# Patient Record
Sex: Female | Born: 1980 | Race: White | Hispanic: No | Marital: Married | Smoking: Current every day smoker
Health system: Southern US, Community
[De-identification: ages and names within clinical notes are randomized; demographics above are authoritative.]

## PROBLEM LIST (undated history)

## (undated) HISTORY — PX: CHOLECYSTECTOMY: SHX55

## (undated) HISTORY — PX: KNEE SURGERY: SHX244

---

## 2004-11-27 ENCOUNTER — Ambulatory Visit (HOSPITAL_COMMUNITY): Payer: Self-pay | Admitting: Psychiatry

## 2004-12-17 ENCOUNTER — Ambulatory Visit (HOSPITAL_COMMUNITY): Payer: Self-pay | Admitting: Licensed Clinical Social Worker

## 2010-05-08 ENCOUNTER — Emergency Department (HOSPITAL_BASED_OUTPATIENT_CLINIC_OR_DEPARTMENT_OTHER)
Admission: EM | Admit: 2010-05-08 | Discharge: 2010-05-08 | Payer: Self-pay | Source: Home / Self Care | Admitting: Emergency Medicine

## 2010-10-02 ENCOUNTER — Emergency Department (HOSPITAL_BASED_OUTPATIENT_CLINIC_OR_DEPARTMENT_OTHER)
Admission: EM | Admit: 2010-10-02 | Discharge: 2010-10-03 | Disposition: A | Discharge status: Other Acute Inpatient Hospital | Payer: BC Managed Care – PPO | Attending: Emergency Medicine | Admitting: Emergency Medicine

## 2010-10-02 DIAGNOSIS — A419 Sepsis, unspecified organism: Secondary | ICD-10-CM | POA: Insufficient documentation

## 2010-10-02 DIAGNOSIS — F17200 Nicotine dependence, unspecified, uncomplicated: Secondary | ICD-10-CM | POA: Insufficient documentation

## 2010-10-02 DIAGNOSIS — Z79899 Other long term (current) drug therapy: Secondary | ICD-10-CM | POA: Insufficient documentation

## 2010-10-02 DIAGNOSIS — N12 Tubulo-interstitial nephritis, not specified as acute or chronic: Secondary | ICD-10-CM | POA: Insufficient documentation

## 2010-10-02 DIAGNOSIS — R300 Dysuria: Secondary | ICD-10-CM | POA: Insufficient documentation

## 2010-10-02 DIAGNOSIS — K219 Gastro-esophageal reflux disease without esophagitis: Secondary | ICD-10-CM | POA: Insufficient documentation

## 2010-10-02 LAB — DIFFERENTIAL
Basophils Relative: 0 % (ref 0–1)
Eosinophils Absolute: 0 10*3/uL (ref 0.0–0.7)
Lymphs Abs: 1.3 10*3/uL (ref 0.7–4.0)
Monocytes Absolute: 1.5 10*3/uL — ABNORMAL HIGH (ref 0.1–1.0)
Neutrophils Relative %: 77 % (ref 43–77)

## 2010-10-02 LAB — CBC
MCHC: 34.2 g/dL (ref 30.0–36.0)
MCV: 86.9 fL (ref 78.0–100.0)
Platelets: 190 10*3/uL (ref 150–400)
RDW: 14.4 % (ref 11.5–15.5)
WBC: 12.1 10*3/uL — ABNORMAL HIGH (ref 4.0–10.5)

## 2010-10-02 LAB — COMPREHENSIVE METABOLIC PANEL
AST: 15 U/L (ref 0–37)
Albumin: 3.2 g/dL — ABNORMAL LOW (ref 3.5–5.2)
BUN: 7 mg/dL (ref 6–23)
Calcium: 8.6 mg/dL (ref 8.4–10.5)
Chloride: 99 mEq/L (ref 96–112)
Creatinine, Ser: 0.8 mg/dL (ref 0.50–1.10)
Total Protein: 6.9 g/dL (ref 6.0–8.3)

## 2010-10-02 LAB — URINE MICROSCOPIC-ADD ON

## 2010-10-02 LAB — URINALYSIS, ROUTINE W REFLEX MICROSCOPIC
Glucose, UA: NEGATIVE mg/dL
Ketones, ur: NEGATIVE mg/dL
Protein, ur: 100 mg/dL — AB
pH: 6.5 (ref 5.0–8.0)

## 2010-10-02 LAB — LACTIC ACID, PLASMA: Lactic Acid, Venous: 1.7 mmol/L (ref 0.5–2.2)

## 2010-10-02 LAB — PREGNANCY, URINE: Preg Test, Ur: NEGATIVE

## 2010-10-03 ENCOUNTER — Inpatient Hospital Stay (HOSPITAL_COMMUNITY): Payer: BC Managed Care – PPO

## 2010-10-03 ENCOUNTER — Inpatient Hospital Stay (HOSPITAL_COMMUNITY)
Admission: AD | Admit: 2010-10-03 | Discharge: 2010-10-06 | DRG: 320 | Disposition: A | Discharge status: Home / Self Care | Payer: BC Managed Care – PPO | Source: Other Acute Inpatient Hospital | Attending: Internal Medicine | Admitting: Internal Medicine

## 2010-10-03 DIAGNOSIS — F411 Generalized anxiety disorder: Secondary | ICD-10-CM | POA: Diagnosis present

## 2010-10-03 DIAGNOSIS — R079 Chest pain, unspecified: Secondary | ICD-10-CM | POA: Diagnosis present

## 2010-10-03 DIAGNOSIS — E876 Hypokalemia: Secondary | ICD-10-CM | POA: Diagnosis not present

## 2010-10-03 DIAGNOSIS — N10 Acute tubulo-interstitial nephritis: Principal | ICD-10-CM | POA: Diagnosis present

## 2010-10-03 DIAGNOSIS — F17200 Nicotine dependence, unspecified, uncomplicated: Secondary | ICD-10-CM | POA: Diagnosis present

## 2010-10-03 DIAGNOSIS — K219 Gastro-esophageal reflux disease without esophagitis: Secondary | ICD-10-CM | POA: Diagnosis present

## 2010-10-03 LAB — CBC
HCT: 34.8 % — ABNORMAL LOW (ref 36.0–46.0)
Hemoglobin: 12.1 g/dL (ref 12.0–15.0)
RBC: 3.98 MIL/uL (ref 3.87–5.11)
WBC: 11.1 10*3/uL — ABNORMAL HIGH (ref 4.0–10.5)

## 2010-10-03 LAB — PROCALCITONIN: Procalcitonin: 0.31 ng/mL

## 2010-10-03 LAB — CK TOTAL AND CKMB (NOT AT ARMC)
CK, MB: 1 ng/mL (ref 0.3–4.0)
CK, MB: 1.1 ng/mL (ref 0.3–4.0)
CK, MB: 1 ng/mL (ref 0.3–4.0)
Relative Index: INVALID (ref 0.0–2.5)
Total CK: 92 U/L (ref 7–177)
Total CK: 103 U/L (ref 7–177)
Total CK: 97 U/L (ref 7–177)

## 2010-10-03 LAB — BASIC METABOLIC PANEL
Calcium: 7.5 mg/dL — ABNORMAL LOW (ref 8.4–10.5)
GFR calc Af Amer: >60 mL/min (ref >60)
GFR calc non Af Amer: >60 mL/min (ref >60)
Glucose, Bld: 123 mg/dL — ABNORMAL HIGH (ref 70–99)
Potassium: 3.2 mEq/L — ABNORMAL LOW (ref 3.5–5.1)
Sodium: 133 mEq/L — ABNORMAL LOW (ref 135–145)

## 2010-10-03 LAB — TROPONIN I: Troponin I: <0.3 ng/mL (ref <0.30)

## 2010-10-03 LAB — URINE CULTURE: Colony Count: 50000

## 2010-10-03 MED ORDER — IOHEXOL 300 MG/ML  SOLN
100.0000 mL | Freq: Once | INTRAMUSCULAR | Status: AC | PRN
  Administered 2010-10-03: 100 mL via INTRAVENOUS

## 2010-10-04 ENCOUNTER — Inpatient Hospital Stay (HOSPITAL_COMMUNITY): Payer: BC Managed Care – PPO

## 2010-10-04 LAB — URINE CULTURE: Colony Count: NO GROWTH

## 2010-10-04 LAB — COMPREHENSIVE METABOLIC PANEL
Alkaline Phosphatase: 51 U/L (ref 39–117)
BUN: 3 mg/dL — ABNORMAL LOW (ref 6–23)
Creatinine, Ser: 0.59 mg/dL (ref 0.50–1.10)
GFR calc Af Amer: >60 mL/min (ref >60)
Glucose, Bld: 116 mg/dL — ABNORMAL HIGH (ref 70–99)
Potassium: 3.3 mEq/L — ABNORMAL LOW (ref 3.5–5.1)
Total Bilirubin: 0.2 mg/dL — ABNORMAL LOW (ref 0.3–1.2)
Total Protein: 6.2 g/dL (ref 6.0–8.3)

## 2010-10-04 LAB — CBC
Hemoglobin: 11 g/dL — ABNORMAL LOW (ref 12.0–15.0)
MCH: 30.1 pg (ref 26.0–34.0)
MCHC: 34.5 g/dL (ref 30.0–36.0)
MCV: 87.4 fL (ref 78.0–100.0)
RBC: 3.65 MIL/uL — ABNORMAL LOW (ref 3.87–5.11)

## 2010-10-05 LAB — BASIC METABOLIC PANEL
BUN: 3 mg/dL — ABNORMAL LOW (ref 6–23)
Chloride: 103 mEq/L (ref 96–112)
Glucose, Bld: 84 mg/dL (ref 70–99)
Potassium: 3.9 mEq/L (ref 3.5–5.1)

## 2010-10-09 LAB — CULTURE, BLOOD (ROUTINE X 2)
Culture  Setup Time: 201206210224
Culture  Setup Time: 201206211250
Culture: NO GROWTH
Culture: NO GROWTH
Culture: NO GROWTH

## 2010-10-09 NOTE — H&P (Signed)
[name], [name]                 ACCOUNT NO.:  [id number]  MEDICAL RECORD NO.:  [id number]  LOCATION:  2505                         FACILITY:  [facility]  PHYSICIAN:  Kela Millin, M.D.DATE OF BIRTH:  12-16-1980  DATE OF ADMISSION:  10/03/2010 DATE OF DISCHARGE:                             HISTORY & PHYSICAL   CHIEF COMPLAINT:  Fevers x3days with lower abdominal pain.  HISTORY OF PRESENT ILLNESS:  The patient is a pleasant 30 year old white female with past medical history significant for recently diagnosed urinary tract infection, for which she was started on Macrobid at [facility] about 2 days ago, also history of GERD, tobacco abuse who presents with above complaints.  She states that she was in her usual state of health until about 4 days ago when she developed lower abdominal and back pain.  She also began having fevers and went to [facility] where she was started on Macrobid 2 days ago, but she has continued to be febrile and also complaining of flank pain, right greater than left.  She stated that she has been taking ibuprofen at home to help control the fevers, but they were persisting and she reported that she took an Extra-Strength Tylenol prior to going to the [facility] ED but was still found to have a temperature of 103 upon arrival.  She also endorses chest pain - described as a mid sternal to left precordial chest tightness, 2/10 in intensity.  She denies any associated nausea or vomiting or shortness of breath.  She admits to diaphoresis but states that she attributed that to the fevers she was having.  The patient was seen at the [facility] ED and a urinalysis was done and revealed large hemoglobin with moderate leukocyte esterase and 11-20 wbc's, a few bacteria, and rare urine epithelial cells.  Her blood pressure upon arrival at the ED was 91/59 and improved to 119/69 on recheck, she was initially also tachycardic at 130 and that improved to 102.  She was  started on empiric antibiotics following the urinalysis and admitted for further evaluation and management.  The patient also complains of neck pain, but denies nuchal rigidity.  PAST MEDICAL HISTORY: 1. As above. 2. History of acne, for which she takes ampicillin.  MEDICATIONS:  Macrobid, Vicodin, Prilosec, ampicillin, oral contraceptives.  ALLERGIES:  CIPRO, MINOCYCLINE, and SULFA.  SOCIAL HISTORY:  She smokes a pack of cigarettes every other day, occasional alcohol.  FAMILY HISTORY:  Her maternal grandfather died in his early 56s of a heart attack.  REVIEW OF SYSTEMS:  As per HPI, other review of systems are negative.  PHYSICAL EXAMINATION:  GENERAL:  The patient is a young white female in no respiratory distress. VITAL SIGNS:  Temperature is 102.2 with a pulse of 112, respiratory rate of 16, blood pressure 146/83, initial blood pressure 91/59, HEENT:  PERRL, EOMI, moist mucous membranes, and no oral exudates. NECK:  Supple, no adenopathy, no thyromegaly, and no JVD. LUNGS:  Clear to auscultation bilaterally.  No crackles or wheezes. ABDOMEN:  Suprapubic tenderness present, no rebound tenderness.  Bowel sounds present and no masses palpable.  Also, right CVA tenderness greater than left. EXTREMITIES:  No cyanosis  and no edema. NEURO:  She is alert and oriented x3.  Cranial nerves II through XII grossly intact.  Nonfocal exam.  LABORATORY DATA:  Urinalysis as per HPI.  Also, her sodium is 133 with a potassium of 3.6, chloride of 99, CO2 of 20, glucose 143, BUN of 7, creatinine of 0.80, calcium 8.6, total protein of 6.9, albumin is 3.2. White cell count is 12.1 with a hemoglobin of 12.5 and a hematocrit of 36.5, the platelet count is 190, and her lactic acid level was 1.7.  ASSESSMENT AND PLAN: 1. Probable pyelonephritis/urinary tract infection with sepsis     syndrome - we will obtain blood and urine cultures, empiric     antibiotics, and follow. 2. Chest pain - as  discussed above, we will cycle cardiac enzymes,     place on aspirin, nitroglycerin, follow, and consider cardiac     consultation pending enzymes. 3. Neck pain - cardiac enzymes as above, obtain x-rays of her neck,     follow, and further manage accordingly, pain management. 4. Gastroesophageal reflux disease - was placed on a PPI. 5. Tobacco - smoking cessation consult.     Kela Millin, M.D.     ACV/MEDQ  D:  10/03/2010  T:  10/03/2010  Job:  [id number]  Electronically Signed by Donnalee Curry M.D. on 10/09/2010 07:37:04 AM

## 2010-10-16 NOTE — Discharge Summary (Signed)
Andrea [name], Andrea [name] NO.:  [id number]  MEDICAL RECORD NO.:  [id number]  LOCATION:  2505                         FACILITY:  [facility]  PHYSICIAN:  Osvaldo Shipper, MD     DATE OF BIRTH:  1980-10-06  DATE OF ADMISSION:  10/03/2010 DATE OF DISCHARGE:  10/06/2010                              DISCHARGE SUMMARY   The [facility].  No consultations obtained during this admission.  Imaging studies done include: 1. Chest x-ray which showed no acute abnormalities. 2. CT of the chest with contrast did not show any PE or any     dissection. 3. Ultrasound of the kidneys showed possible scarring in the right     kidney, otherwise did not show any other abnormalities.  DISCHARGE DIAGNOSES: 1. Acute pyelonephritis, improved. 2. Chest pain, probably related to her acute illness and anxiety,     resolved. 3. Hypokalemia, corrected. 4. History of gastroesophageal reflux disease, stable. 5. History of tobacco abuse, counseled.  BRIEF HOSPITAL COURSE:  Briefly, this is a 30 year old Caucasian female who presented to the hospital with complaints of fever and lower abdominal pain.  Her UA was abnormal and she was admitted to the hospital with a diagnosis of acute pyelonephritis.  Apparently, she had been to [facility] a few days earlier with similar complaints, had a UA which was remarkably abnormal, and she was started on Macrobid. Unfortunately, no cultures were sent at [facility].  Cultures were done here, however, they have not grown any specific organism.  Her fevers took about 48 hours to resolve.  She also had some lower abdominal discomfort along with some back pain which has also resolved now.  Her white count was elevated at 12.1, which has corrected as well.  She was mentioning some chest pain at the time of admission which was left sided.  Cardiac enzymes were negative.  Evaluation for PE was negative.  The chest pain spontaneously resolved.  It  could have been related to stress and anxiety that the patient has been experiencing over the last few days, especially since one of her close friends passed away.  She was also hypokalemic during this admission which was repleted.  She is complaining of yeast infection in her vaginal area today, so she will be prescribed Diflucan.  She has been asked to hold her ampicillin which she takes for acne while she is on the other antibiotic, Vantin for her pyelonephritis.  On the day of discharge, the patient is feeling better.  She is ambulating with no difficulties.  Fevers have resolved.  Her last temperature is 98.8.  Last time she was febrile with actually a low- grade temperature at 100.8 at 4 a.m. on October 05, 2010.  Blood pressure and heart rate are all normal.  Respiratory rate is normal.  Saturation is normal.  Lungs are clear to auscultation bilaterally. Cardiovascular, S1 and S2 are normal, regular.  No S3, S4, rubs, murmurs, or bruit.  Abdomen is soft, nontender, and nondistended.  Bowel sounds are present.  No masses or organomegaly appreciated.  No labs today.  ASSESSMENT/PLAN:  As per above.  So, overall, the patient is stable  for discharge.  DISCHARGE MEDICATIONS: 1. Diflucan 100 mg daily for 5 days. 2. Vantin 200 mg twice daily for 10 days. 3. Ampicillin to be held while she is on the [place]. 4. Prilosec 20 mg daily. 5. Seasonique which is a birth control pill 1 tablet daily.  FOLLOWUP:  With her primary care providers in 1-2 weeks.  DIET:  Regular.  PHYSICAL ACTIVITY:  As tolerated.  She may return to work on October 14, 2010, or earlier if she feels better.  Total time on this discharge encounter 35 minutes.  Osvaldo Shipper, MD     GK/MEDQ  D:  10/06/2010  T:  10/06/2010  Job:  [id number]  cc:   [facility]  Electronically Signed by Osvaldo Shipper MD on 10/16/2010 07:51:04 PM

## 2012-11-23 ENCOUNTER — Emergency Department (HOSPITAL_BASED_OUTPATIENT_CLINIC_OR_DEPARTMENT_OTHER)
Admission: EM | Admit: 2012-11-23 | Discharge: 2012-11-23 | Disposition: A | Discharge status: Home / Self Care | Payer: BC Managed Care – PPO | Attending: Emergency Medicine | Admitting: Emergency Medicine

## 2012-11-23 ENCOUNTER — Encounter (HOSPITAL_BASED_OUTPATIENT_CLINIC_OR_DEPARTMENT_OTHER): Payer: Self-pay

## 2012-11-23 ENCOUNTER — Emergency Department (HOSPITAL_BASED_OUTPATIENT_CLINIC_OR_DEPARTMENT_OTHER): Payer: BC Managed Care – PPO

## 2012-11-23 DIAGNOSIS — Z87440 Personal history of urinary (tract) infections: Secondary | ICD-10-CM | POA: Insufficient documentation

## 2012-11-23 DIAGNOSIS — Z3202 Encounter for pregnancy test, result negative: Secondary | ICD-10-CM | POA: Insufficient documentation

## 2012-11-23 DIAGNOSIS — R51 Headache: Secondary | ICD-10-CM | POA: Insufficient documentation

## 2012-11-23 DIAGNOSIS — Y9389 Activity, other specified: Secondary | ICD-10-CM | POA: Insufficient documentation

## 2012-11-23 DIAGNOSIS — W108XXA Fall (on) (from) other stairs and steps, initial encounter: Secondary | ICD-10-CM | POA: Insufficient documentation

## 2012-11-23 DIAGNOSIS — Z79899 Other long term (current) drug therapy: Secondary | ICD-10-CM | POA: Insufficient documentation

## 2012-11-23 DIAGNOSIS — M545 Low back pain: Secondary | ICD-10-CM

## 2012-11-23 DIAGNOSIS — F17200 Nicotine dependence, unspecified, uncomplicated: Secondary | ICD-10-CM | POA: Insufficient documentation

## 2012-11-23 DIAGNOSIS — IMO0002 Reserved for concepts with insufficient information to code with codable children: Secondary | ICD-10-CM | POA: Insufficient documentation

## 2012-11-23 DIAGNOSIS — R110 Nausea: Secondary | ICD-10-CM | POA: Insufficient documentation

## 2012-11-23 DIAGNOSIS — Y929 Unspecified place or not applicable: Secondary | ICD-10-CM | POA: Insufficient documentation

## 2012-11-23 LAB — URINE MICROSCOPIC-ADD ON

## 2012-11-23 LAB — URINALYSIS, ROUTINE W REFLEX MICROSCOPIC
Glucose, UA: NEGATIVE mg/dL
Leukocytes, UA: NEGATIVE
pH: 5.5 (ref 5.0–8.0)

## 2012-11-23 MED ORDER — CYCLOBENZAPRINE HCL 5 MG PO TABS: 5.0000 mg | ORAL_TABLET | Freq: Three times a day (TID) | ORAL | Status: AC | PRN

## 2012-11-23 NOTE — ED Notes (Signed)
Pt reports also with hx of utis and pain is similar to this.

## 2012-11-23 NOTE — ED Provider Notes (Signed)
CSN: [id number]     Arrival date & time 11/23/12  1519 History     First MD Initiated Contact with Patient 11/23/12 1543     Chief Complaint  Patient presents with  . Back Pain   HPI  Ricquel Foulk is a 32 year old female with no past medical history who presents to the emergency room for evaluation of lower back pain. Patient states that she's been having lower back pain off-and-on for the past 6 months. She states that her pain is worse when she wakes up the morning and improves throughout the day.  On November 13, 2012 she states that she slipped down a few wet stairs and sustained an injury to her lower back.  No other injuries including head or neck injury.  Since then, she's been having increased lower back pain which is then more continuous in nature.  She describes her pain as a dull aching equal on the left and right.  She denies any numbness, tingling, loss of sensation, or focal weakness.  She has been able to ambulate without difficulty.  Patient states that her pain is increased when she bends forward or twists side to side.  She's tried over-the-counter pain medications without relief.  She is concerned that her pain is not improved which is why she is in the emergency department today.  She also has been nauseated the last few days without any emesis.  She also has had headaches off and on for the past few days with no neck pain/stiffness.  She did not currently have a headache.  She denies any fever, chills, change in activity or appetite, rhinorrhea, congestion, sore throat, cough, shortness of breath, chest pain, abdominal pain, vaginal discharge, vaginal bleeding, dysuria, hematuria, vaginal itching.  She has an IUD.  Patient states she has a history of UTI's which are not similar to her symptoms today.     History reviewed. No pertinent past medical history. Past Surgical History  Procedure Laterality Date  . Knee surgery    . Cholecystectomy     No family history on file. History   Substance Use Topics  . Smoking status: Current Every Day Smoker -- 0.50 packs/day    Types: Cigarettes  . Smokeless tobacco: Not on file  . Alcohol Use: Yes   OB History   Grav Para Term Preterm Abortions TAB SAB Ect Mult Living                 Review of Systems  Constitutional: Negative for fever, chills, activity change, appetite change and fatigue.  HENT: Negative for congestion, sore throat, rhinorrhea, neck pain and neck stiffness.   Eyes: Negative for visual disturbance.  Respiratory: Negative for cough and shortness of breath.   Cardiovascular: Negative for chest pain and leg swelling.  Gastrointestinal: Positive for nausea. Negative for vomiting, abdominal pain, diarrhea and constipation.  Endocrine: Negative for polyuria.  Genitourinary: Negative for dysuria, hematuria, vaginal bleeding, vaginal discharge and vaginal pain.  Musculoskeletal: Positive for back pain. Negative for myalgias, joint swelling, arthralgias and gait problem.  Skin: Negative for wound.  Neurological: Positive for headaches (See HPI). Negative for dizziness, syncope, weakness, light-headedness and numbness.    Allergies  Ciprofloxacin and Sulfa antibiotics  Home Medications   Current Outpatient Rx  Name  Route  Sig  Dispense  Refill  . omeprazole (PRILOSEC) 10 MG capsule   Oral   Take 10 mg by mouth daily.  BP 142/84  Pulse 100  Temp(Src) 98.2 F (36.8 C) (Oral)  Resp 20  Ht 5\' 9"  (1.753 m)  Wt 210 lb (95.255 kg)  BMI 31 kg/m2  SpO2 97%   Filed Vitals:   11/23/12 1523  BP: 142/84  Pulse: 100  Temp: 98.2 F (36.8 C)  TempSrc: Oral  Resp: 20  Height: 5\' 9"  (1.753 m)  Weight: 210 lb (95.255 kg)  SpO2: 97%    Physical Exam  Nursing note and vitals reviewed. Constitutional: She is oriented to person, place, and time. She appears well-developed and well-nourished. No distress.  HENT:  Head: Normocephalic and atraumatic.  Right Ear: External ear normal.  Left Ear:  External ear normal.  Nose: Nose normal.  Eyes: Conjunctivae are normal. Pupils are equal, round, and reactive to light. Right eye exhibits no discharge. Left eye exhibits no discharge.  Neck: Normal range of motion. Neck supple.  No cervical spinal or paraspinal tenderness to palpation.  No limitations of neck range of motion  Cardiovascular: Normal rate, regular rhythm, normal heart sounds and intact distal pulses.  Exam reveals no gallop and no friction rub.   No murmur heard. Dorsalis pedis pulses present and equal bilaterally  Pulmonary/Chest: Effort normal and breath sounds normal. No respiratory distress. She has no wheezes. She has no rales. She exhibits no tenderness.  Abdominal: Soft. Bowel sounds are normal. She exhibits no distension and no mass. There is no tenderness. There is no rebound and no guarding.  Musculoskeletal: Normal range of motion. She exhibits no edema and no tenderness.  Diffuse tenderness to palpation to the lumbar spinal processes and paraspinal muscles of the lower back with no focal tenderness.  No overlying erythema, ecchymosis, or evidence of any wounds.  Unable to sit up for exam without difficulty.  Negative straight leg raise bilaterally.  Patient refuses to bend forward to touch her toes due to pain.  Patient able to ambulate without difficulty or ataxia.    Neurological: She is alert and oriented to person, place, and time.  Gross sensation intact in the lower extremities  Skin: Skin is warm and dry. She is not diaphoretic.    ED Course   Procedures (including critical care time)  Labs Reviewed  URINALYSIS, ROUTINE W REFLEX MICROSCOPIC - Abnormal; Notable for the following:    Hgb urine dipstick SMALL (*)    All other components within normal limits  PREGNANCY, URINE  URINE MICROSCOPIC-ADD ON   No results found. No diagnosis found.  Results for orders placed during the hospital encounter of 11/23/12  PREGNANCY, URINE      Result Value Range    Preg Test, Ur NEGATIVE  NEGATIVE  URINALYSIS, ROUTINE W REFLEX MICROSCOPIC      Result Value Range   Color, Urine YELLOW  YELLOW   APPearance CLEAR  CLEAR   Specific Gravity, Urine 1.024  1.005 - 1.030   pH 5.5  5.0 - 8.0   Glucose, UA NEGATIVE  NEGATIVE mg/dL   Hgb urine dipstick SMALL (*) NEGATIVE   Bilirubin Urine NEGATIVE  NEGATIVE   Ketones, ur NEGATIVE  NEGATIVE mg/dL   Protein, ur NEGATIVE  NEGATIVE mg/dL   Urobilinogen, UA 0.2  0.0 - 1.0 mg/dL   Nitrite NEGATIVE  NEGATIVE   Leukocytes, UA NEGATIVE  NEGATIVE  URINE MICROSCOPIC-ADD ON      Result Value Range   Squamous Epithelial / LPF RARE  RARE   WBC, UA 0-2  <3 WBC/hpf   RBC /  HPF 3-6  <3 RBC/hpf   Bacteria, UA RARE  RARE   Urine-Other MUCOUS PRESENT     DG Lumbar Spine Complete (Final result)  Result time: 11/23/12 16:33:59    Final result by Rad Results In Interface (11/23/12 16:33:59)    Narrative:   *RADIOLOGY REPORT*  Clinical Data: Back pain; recent trauma  LUMBAR SPINE - COMPLETE 4+ VIEW  Comparison: None.  Findings: Frontal, lateral, spot lumbosacral lateral, and bilateral oblique views were obtained. There are five non-rib bearing lumbar type vertebral bodies. There is minimal dextroscoliosis. There is no fracture or spondylolisthesis. Disc spaces appear intact. There is no appreciable facet arthropathic change. There is an intrauterine device in the mid pelvis.  IMPRESSION: Minimal scoliosis. No fracture or appreciable arthropathy. Intrauterine device in the mid pelvis.   Original Report Authenticated By: Bretta Bang, M.D.    MDM  Keylin Ferryman is a 32 year old female with no past medical history who presents to the emergency room for evaluation of lower back pain.  X-rays of lumbar spine ordered to further evaluate.  UA and urine pregnancy ordered to evaluate for urinary tract infection.     Rechecks  3:50 PM = Patient refused pain medications at this time.  Will re-assess  4:52 PM =  Patient reading her Kindle in no distress when I entered the room.  No concerns.     Etiology of back pain possibly due to a contusion vs. sprain/strain.  X-rays were negative for fracture or malalignment.  Urine is not suggestive of a urinary tract infection. Vital signs stable and the patient remained in no acute distress throughout her ED visit. Patient declined pain medications.  There was no external evidence of trauma on exam and she remained neurovascularly intact.  Patient was prescribed flexeril and instructed not to drive or drink alcohol with this medication.  She is encouraged to do gentle stretching and apply warm and cold compresses for symptomatic relief. She was instructed to follow up with her primary care provider sometime this week for further evaluation and management. Patient was encouraged to inquire about a followup MRI or possible physical therapy.  Patient is encouraged to return to the emergency department if she develops any fever, repeated emesis, numbness or tingling in her legs, loss of sensation, or other concerns.  Patient was in agreement discharge and plan.     Final impression: 1. Lower back pain    Greer Ee Roann Merk PA-C     Jillyn Ledger, PA-C 11/23/12 2314

## 2012-11-23 NOTE — ED Notes (Signed)
Pa  at bedside. 

## 2012-11-23 NOTE — ED Notes (Signed)
Pt reports back pain for about 6 months intermittent, august 2 had a fall.  Has had lower lumbar pain and nausea since this time.

## 2012-11-24 NOTE — ED Provider Notes (Signed)
Medical screening examination/treatment/procedure(s) were performed by non-physician practitioner and as supervising physician I was immediately available for consultation/collaboration.   Audree Camel, MD 11/24/12 1122

## 2017-01-24 DIAGNOSIS — R1011 Right upper quadrant pain: Secondary | ICD-10-CM | POA: Diagnosis not present

## 2017-01-24 DIAGNOSIS — F17210 Nicotine dependence, cigarettes, uncomplicated: Secondary | ICD-10-CM | POA: Diagnosis not present

## 2017-01-24 DIAGNOSIS — R1031 Right lower quadrant pain: Secondary | ICD-10-CM | POA: Insufficient documentation

## 2017-01-24 DIAGNOSIS — Z79899 Other long term (current) drug therapy: Secondary | ICD-10-CM | POA: Insufficient documentation

## 2017-01-25 ENCOUNTER — Emergency Department (HOSPITAL_BASED_OUTPATIENT_CLINIC_OR_DEPARTMENT_OTHER)
Admission: EM | Admit: 2017-01-25 | Discharge: 2017-01-25 | Disposition: A | Discharge status: Home / Self Care | Payer: BLUE CROSS/BLUE SHIELD | Attending: Emergency Medicine | Admitting: Emergency Medicine

## 2017-01-25 ENCOUNTER — Emergency Department (HOSPITAL_BASED_OUTPATIENT_CLINIC_OR_DEPARTMENT_OTHER): Payer: BLUE CROSS/BLUE SHIELD

## 2017-01-25 ENCOUNTER — Encounter (HOSPITAL_BASED_OUTPATIENT_CLINIC_OR_DEPARTMENT_OTHER): Payer: Self-pay | Admitting: Emergency Medicine

## 2017-01-25 DIAGNOSIS — R109 Unspecified abdominal pain: Secondary | ICD-10-CM

## 2017-01-25 LAB — CBC WITH DIFFERENTIAL/PLATELET
Basophils Absolute: 0 10*3/uL (ref 0.0–0.1)
Basophils Relative: 0 %
EOS PCT: 2 %
Eosinophils Absolute: 0.1 10*3/uL (ref 0.0–0.7)
HCT: 39 % (ref 36.0–46.0)
Hemoglobin: 13 g/dL (ref 12.0–15.0)
LYMPHS ABS: 3.3 10*3/uL (ref 0.7–4.0)
LYMPHS PCT: 35 %
MCH: 29.9 pg (ref 26.0–34.0)
MCHC: 33.3 g/dL (ref 30.0–36.0)
MCV: 89.7 fL (ref 78.0–100.0)
MONO ABS: 1 10*3/uL (ref 0.1–1.0)
Monocytes Relative: 10 %
Neutro Abs: 5.1 10*3/uL (ref 1.7–7.7)
Neutrophils Relative %: 53 %
PLATELETS: 271 10*3/uL (ref 150–400)
RBC: 4.35 MIL/uL (ref 3.87–5.11)
RDW: 13.2 % (ref 11.5–15.5)
WBC: 9.6 10*3/uL (ref 4.0–10.5)

## 2017-01-25 LAB — URINALYSIS, ROUTINE W REFLEX MICROSCOPIC
BILIRUBIN URINE: NEGATIVE
Glucose, UA: NEGATIVE mg/dL
KETONES UR: NEGATIVE mg/dL
Leukocytes, UA: NEGATIVE
NITRITE: NEGATIVE
Protein, ur: NEGATIVE mg/dL
pH: 6 (ref 5.0–8.0)

## 2017-01-25 LAB — BASIC METABOLIC PANEL
Anion gap: 5 (ref 5–15)
BUN: 11 mg/dL (ref 6–20)
CALCIUM: 9 mg/dL (ref 8.9–10.3)
CO2: 27 mmol/L (ref 22–32)
Chloride: 106 mmol/L (ref 101–111)
Creatinine, Ser: 0.64 mg/dL (ref 0.44–1.00)
GFR calc Af Amer: >60 mL/min (ref >60)
GLUCOSE: 129 mg/dL — AB (ref 65–99)
Potassium: 3.8 mmol/L (ref 3.5–5.1)
Sodium: 138 mmol/L (ref 135–145)

## 2017-01-25 LAB — URINALYSIS, MICROSCOPIC (REFLEX)

## 2017-01-25 LAB — PREGNANCY, URINE: Preg Test, Ur: NEGATIVE

## 2017-01-25 MED ORDER — KETOROLAC TROMETHAMINE 30 MG/ML IJ SOLN
30.0000 mg | Freq: Once | INTRAMUSCULAR | Status: AC
  Administered 2017-01-25: 30 mg via INTRAVENOUS
  Filled 2017-01-25: qty 1

## 2017-01-25 MED ORDER — SODIUM CHLORIDE 0.9 % IV BOLUS (SEPSIS)
1000.0000 mL | Freq: Once | INTRAVENOUS | Status: AC
Start: 2017-01-25 — End: 2017-01-25
  Administered 2017-01-25: 1000 mL via INTRAVENOUS

## 2017-01-25 MED ORDER — NAPROXEN 500 MG PO TABS: 500.0000 mg | ORAL_TABLET | Freq: Two times a day (BID) | ORAL | 0 refills | Status: AC

## 2017-01-25 NOTE — ED Notes (Signed)
No changes, "feel about the same", pain 5/10, denies other sx, using smart phone, alert, NAD, calm, interactive, VSS. EDP in to update pt on results and plan.

## 2017-01-25 NOTE — Discharge Instructions (Signed)
YOu were seen today for right flank pain.  Your work-up is reassuring.  You could have recently passed a kidney stone.  This could also be muscular.  Take naproxen as needed for pain.

## 2017-01-25 NOTE — ED Provider Notes (Signed)
[facility] Note   CSN: [id number] Arrival date & time: 01/24/17  2358     History   Chief Complaint Chief Complaint  Patient presents with  . Back Pain    HPI Andrea [name] is a 36 y.o. female.  HPI  This is a 36 year old female who presents with back pain. Patient reports she had onset of waxing and waning right flank and back pain that started on Thursday. She states it radiates into the right upper quadrant. Currently is 4 out of 10. However, at times she states that it spikes to 10 out of 10. She was seen in an outpatient urgent care and given Flexeril. This did not improve her symptoms.she states she is unable to find a position of comfort. She has had one episode of emesis. She denies any hematuria.  Reports history of UTIs that are asymptomatic. She denies any fevers. No known injury. Denies any weakness, numbness, tingling of the lower extremities.no history of kidney stones.  History reviewed. No pertinent past medical history.  There are no active problems to display for this patient.   Past Surgical History:  Procedure Laterality Date  . CHOLECYSTECTOMY    . KNEE SURGERY      OB History    No data available       Home Medications    Prior to Admission medications   Medication Sig Start Date End Date Taking? Authorizing Provider  cyclobenzaprine (FLEXERIL) 5 MG tablet Take 1 tablet (5 mg total) by mouth 3 (three) times daily as needed for muscle spasms. 11/23/12   Rubye Oaks, Janene Harvey, PA-C  naproxen (NAPROSYN) 500 MG tablet Take 1 tablet (500 mg total) by mouth 2 (two) times daily. 01/25/17   Britani Beattie, Mayer Masker, MD  omeprazole (PRILOSEC) 10 MG capsule Take 10 mg by mouth daily.    [provider]    Family History No family history on file.  Social History Social History  Substance Use Topics  . Smoking status: Current Every Day Smoker    Packs/day: 0.50    Types: Cigarettes  . Smokeless tobacco: Not on file  . Alcohol  use Yes     Allergies   Ciprofloxacin and Sulfa antibiotics   Review of Systems Review of Systems  Constitutional: Negative for fever.  Respiratory: Negative for shortness of breath.   Cardiovascular: Negative for chest pain.  Gastrointestinal: Positive for nausea and vomiting. Negative for abdominal pain and diarrhea.  Genitourinary: Positive for flank pain. Negative for dysuria and hematuria.  Neurological: Negative for weakness and numbness.  All other systems reviewed and are negative.    Physical Exam Updated Vital Signs BP 106/65   Pulse 73   Temp 98.5 F (36.9 C) (Oral)   Resp (!) 23   SpO2 97%   Physical Exam  Constitutional: She is oriented to person, place, and time. No distress.  Uncomfortable appearing but no acute distress  HENT:  Head: Normocephalic and atraumatic.  Cardiovascular: Normal rate, regular rhythm and normal heart sounds.   Pulmonary/Chest: Effort normal and breath sounds normal. No respiratory distress. She has no wheezes.  Abdominal: Soft. Bowel sounds are normal. There is no tenderness.  Genitourinary:  Genitourinary Comments: No CVA tenderness  Neurological: She is alert and oriented to person, place, and time.  Skin: Skin is warm and dry. No rash noted.  Psychiatric: She has a normal mood and affect.  Nursing note and vitals reviewed.    ED Treatments / Results  Labs (all  labs ordered are listed, but only abnormal results are displayed) Labs Reviewed  URINALYSIS, ROUTINE W REFLEX MICROSCOPIC - Abnormal; Notable for the following:       Result Value   APPearance HAZY (*)    Specific Gravity, Urine >1.030 (*)    Hgb urine dipstick SMALL (*)    All other components within normal limits  URINALYSIS, MICROSCOPIC (REFLEX) - Abnormal; Notable for the following:    Bacteria, UA MANY (*)    Squamous Epithelial / LPF 6-30 (*)    All other components within normal limits  BASIC METABOLIC PANEL - Abnormal; Notable for the following:     Glucose, Bld 129 (*)    All other components within normal limits  CBC WITH DIFFERENTIAL/PLATELET  PREGNANCY, URINE    EKG  EKG Interpretation None       Radiology Ct Renal Stone Study  Result Date: 01/25/2017 CLINICAL DATA:  Severe right flank pain and right upper quadrant abdominal pain today. EXAM: CT ABDOMEN AND PELVIS WITHOUT CONTRAST TECHNIQUE: Multidetector CT imaging of the abdomen and pelvis was performed following the standard protocol without IV contrast. COMPARISON:  None. FINDINGS: Lower chest: No acute abnormality. Hepatobiliary: No focal liver abnormality is seen. Status post cholecystectomy. No biliary dilatation. Pancreas: Unremarkable. No pancreatic ductal dilatation or surrounding inflammatory changes. Spleen: Normal in size without focal abnormality. Adrenals/Urinary Tract: Adrenal glands are unremarkable. Kidneys are normal, without renal calculi, focal lesion, or hydronephrosis. Bladder is unremarkable. Stomach/Bowel: Large hiatal hernia. Stomach and small bowel are otherwise unremarkable. Appendix is normal. Colon is normal. Vascular/Lymphatic: No significant vascular findings are present. No enlarged abdominal or pelvic lymph nodes. Reproductive: Status post hysterectomy. No adnexal masses. IUD noted. Other: No focal inflammation.  No ascites. Musculoskeletal: No significant skeletal lesion. IMPRESSION: Large hiatal hernia.  No acute findings in the abdomen or pelvis. Electronically Signed   By: Ellery Plunk M.D.   On: 01/25/2017 02:15    Procedures Procedures (including critical care time)  Medications Ordered in ED Medications  sodium chloride 0.9 % bolus 1,000 mL (0 mLs Intravenous Stopped 01/25/17 0300)  ketorolac (TORADOL) 30 MG/ML injection 30 mg (30 mg Intravenous Given 01/25/17 0208)     Initial Impression / Assessment and Plan / ED Course  I have reviewed the triage vital signs and the nursing notes.  Pertinent labs & imaging results that were  available during my care of the patient were reviewed by me and considered in my medical decision making (see chart for details).     Patient presents with right back and flank pain. She is uncomfortable appearing but nontoxic. Vital signs reassuring. Considerations include kidney stone, musculoskeletal pain, less likely pyelonephritis. No red flags. Patient was given Toradol and fluids. Basic labwork obtained and largely reassuring. CT stone study obtained and negative with the exception of a hiatal hernia.  Patient recently could've passed a stone. However, this could also be musculoskeletal. No evidence of rash to indicate shingles.  Discussed results with patient. Recommend naproxen for pain.  After history, exam, and medical workup I feel the patient has been appropriately medically screened and is safe for discharge home. Pertinent diagnoses were discussed with the patient. Patient was given return precautions.   Final Clinical Impressions(s) / ED Diagnoses   Final diagnoses:  Right flank pain    New Prescriptions New Prescriptions   NAPROXEN (NAPROSYN) 500 MG TABLET    Take 1 tablet (500 mg total) by mouth 2 (two) times daily.     Sanders Manninen,  Mayer Masker, MD 01/25/17 [phone]

## 2017-01-25 NOTE — ED Notes (Signed)
EDP into room, prior to RN assessment, see MD notes, pending orders.   

## 2017-01-25 NOTE — ED Notes (Signed)
Not in room, pt ambulatory to CT, steady gait.

## 2017-01-25 NOTE — ED Notes (Signed)
Alert, NAD, calm, interactive, resps e/u, speaking in clear complete sentences, no dyspnea noted, skin W&D, VSS, c/o R flank pain, (denies: usual UTI sx, sob, fever, NVD, constipation, dizziness or visual changes).

## 2017-01-25 NOTE — ED Triage Notes (Signed)
PT presents with c/o back pain and  RUQ pain

## 2019-05-01 IMAGING — CT CT RENAL STONE PROTOCOL
2 of 4 series · 17 of 46 positions shown, 19 images · non-contrast
Comparison: None.

CLINICAL DATA: Severe right flank pain and right upper quadrant
abdominal pain today.

EXAM:
CT ABDOMEN AND PELVIS WITHOUT CONTRAST
TECHNIQUE: Multidetector CT imaging of the abdomen and pelvis was performed
following the standard protocol without IV contrast.

[Series 2: axial st · axial · 0.90mm/px · z∈[-541,-76]mm · 14 of 103 slices shown, 16 images]
[im 5/103  soft-tissue]
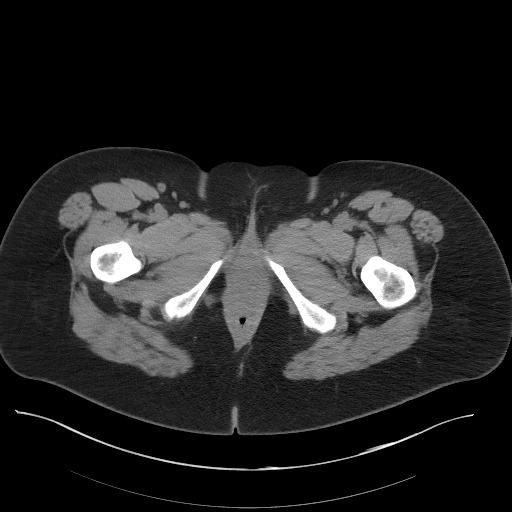
[im 5/103  bone]
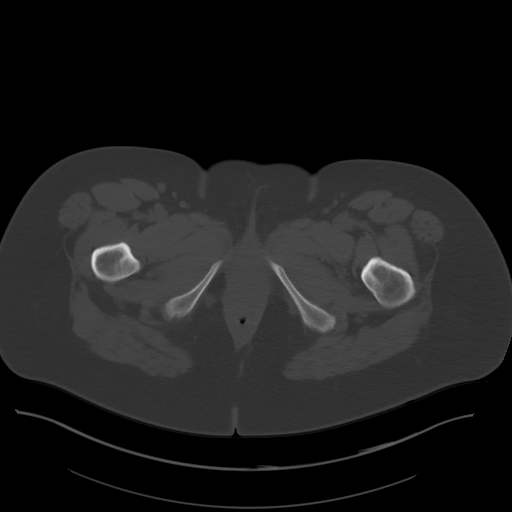
[im 14/103  soft-tissue]
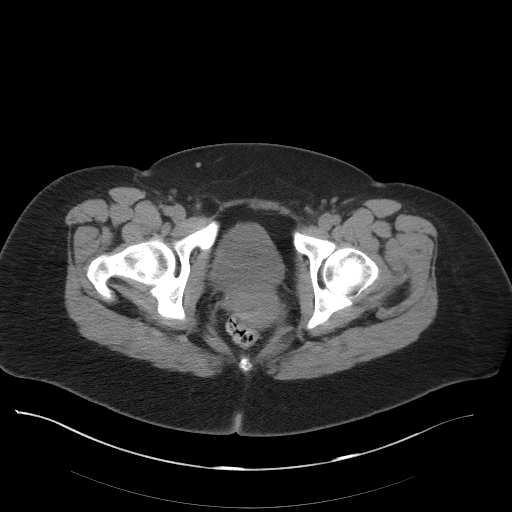
[im 18/103  soft-tissue]
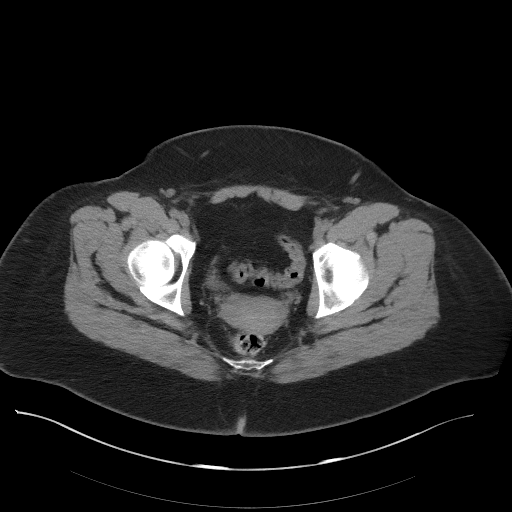
[im 27/103  soft-tissue]
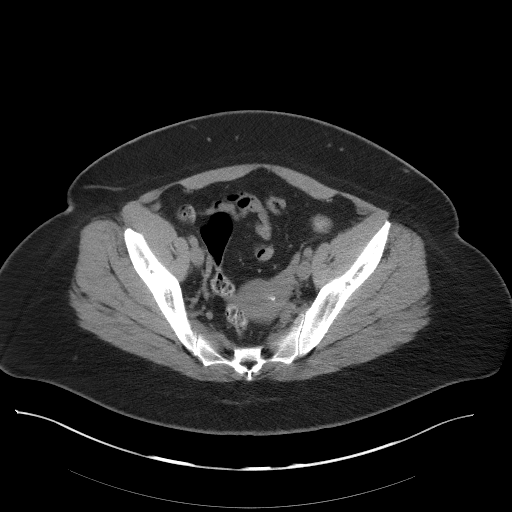
[im 36/103  soft-tissue]
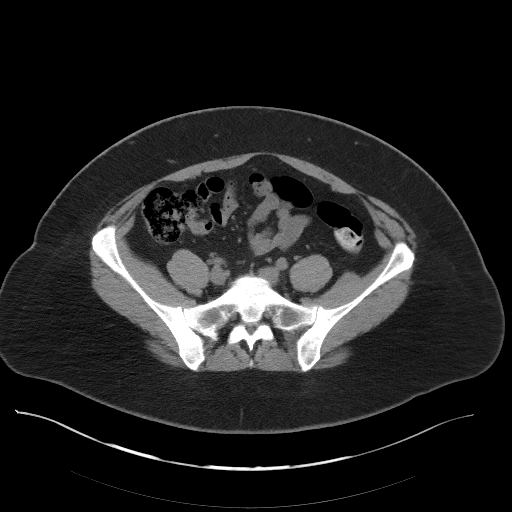
[im 40/103  soft-tissue]
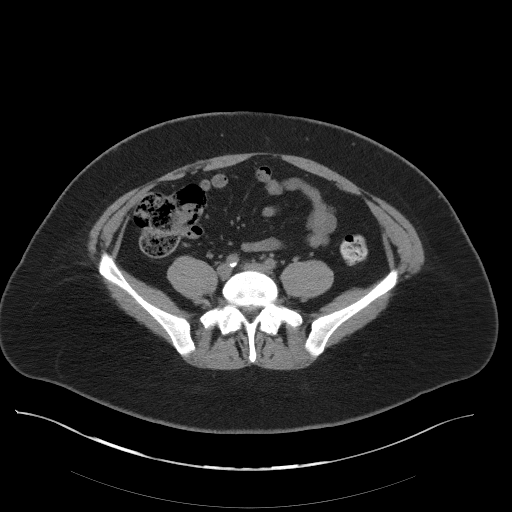
[im 49/103  soft-tissue]
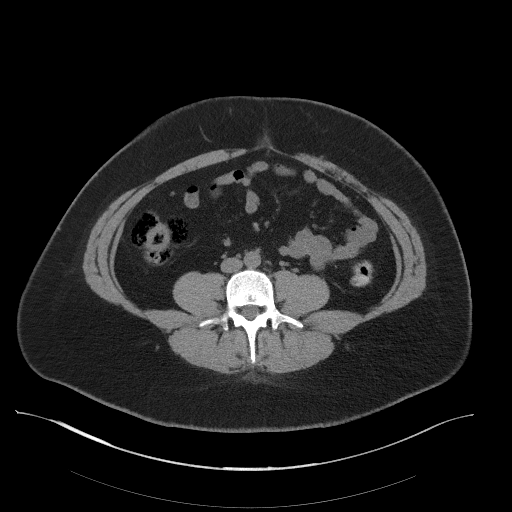
[im 54/103  soft-tissue]
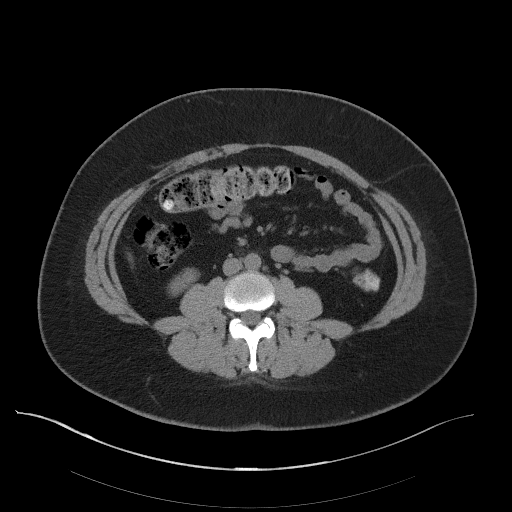
[im 63/103  soft-tissue]
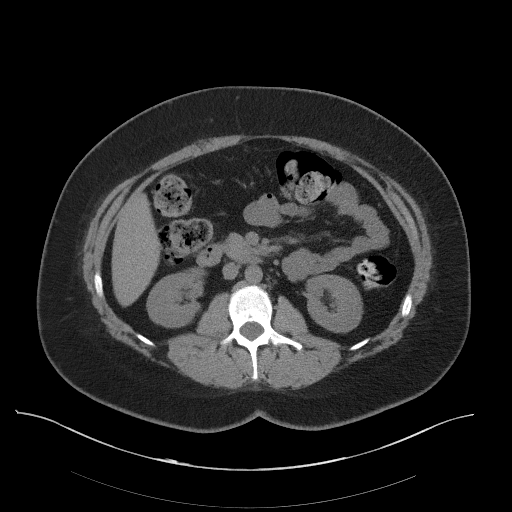
[im 63/103  bone]
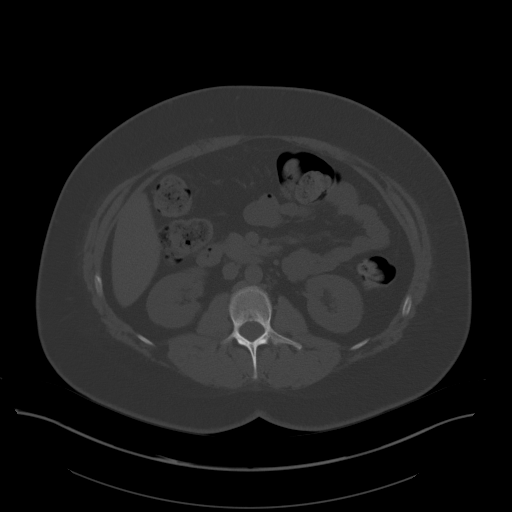
[im 67/103  soft-tissue]
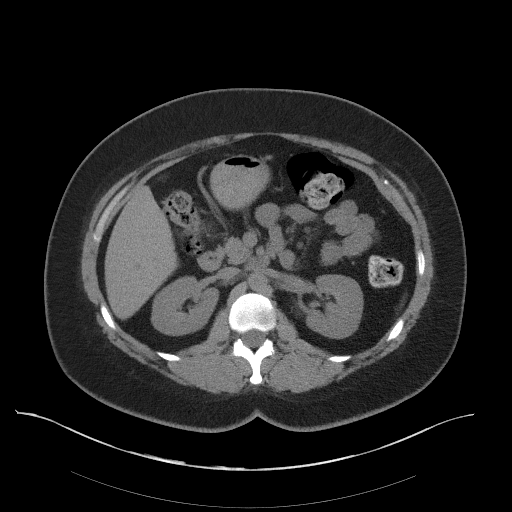
[im 76/103  soft-tissue]
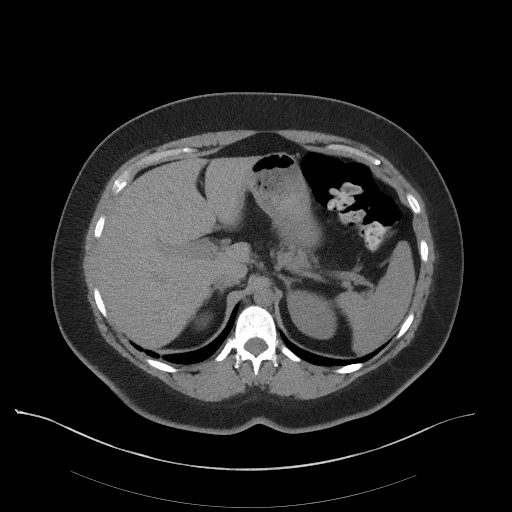
[im 85/103  soft-tissue]
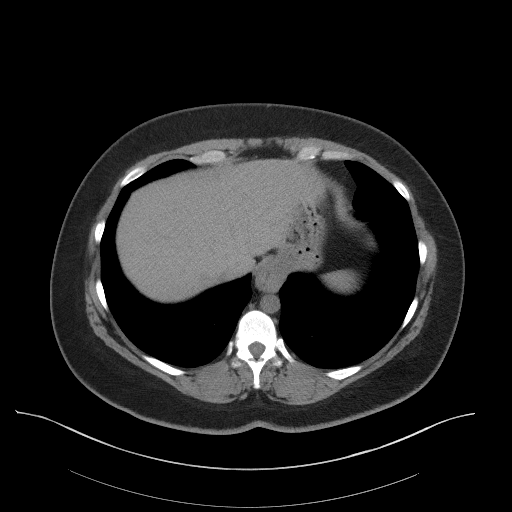
[im 89/103  soft-tissue]
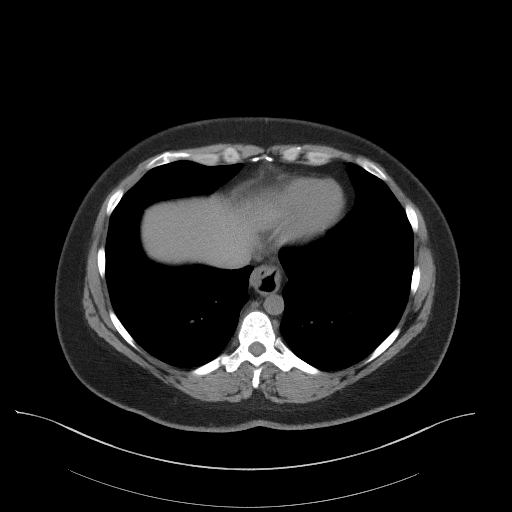
[im 98/103  soft-tissue]
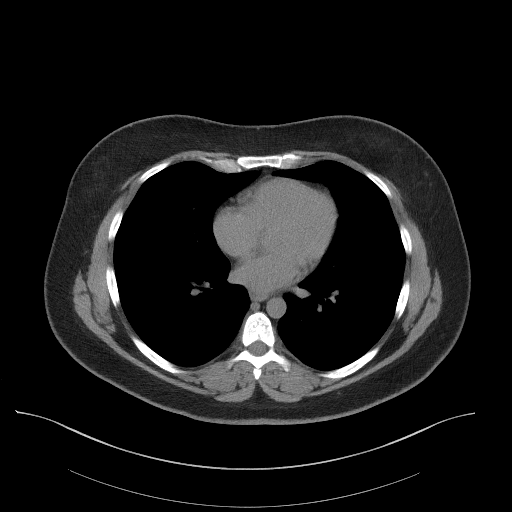

[Series 4: coronal st · coronal · 0.89mm/px · 3 of 83 slices shown]
[im 28/83  soft-tissue]
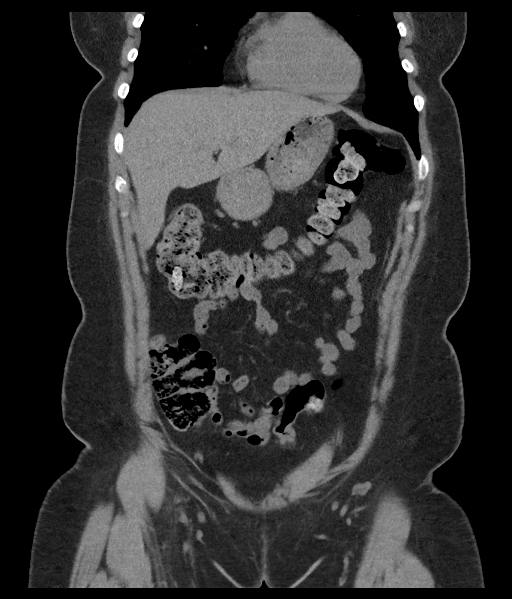
[im 37/83  soft-tissue]
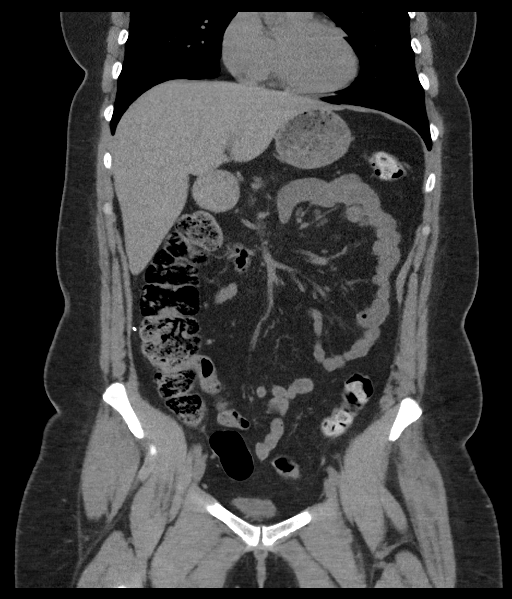
[im 46/83  soft-tissue]
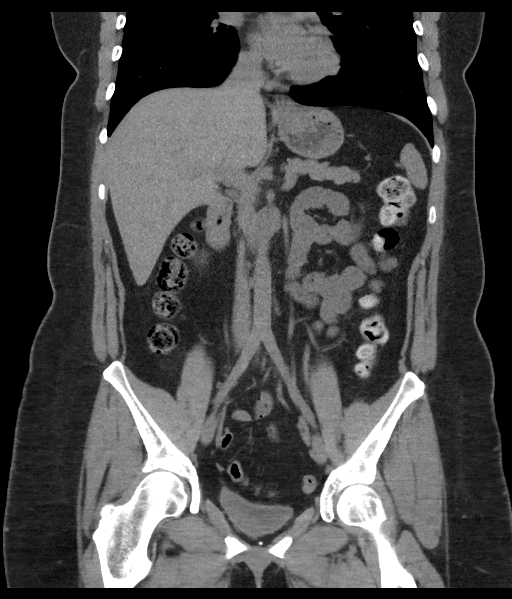

[17 of 46 positions shown; findings below may reference images not displayed]

FINDINGS: Lower chest: No acute abnormality.

Hepatobiliary: No focal liver abnormality is seen. Status post
cholecystectomy. No biliary dilatation.

Pancreas: Unremarkable. No pancreatic ductal dilatation or
surrounding inflammatory changes.

Spleen: Normal in size without focal abnormality.

Adrenals/Urinary Tract: Adrenal glands are unremarkable. Kidneys are
normal, without renal calculi, focal lesion, or hydronephrosis.
Bladder is unremarkable.

Stomach/Bowel: Large hiatal hernia. Stomach and small bowel are
otherwise unremarkable. Appendix is normal. Colon is normal.

Vascular/Lymphatic: No significant vascular findings are present. No
enlarged abdominal or pelvic lymph nodes.

Reproductive: Status post hysterectomy. No adnexal masses. IUD
noted.

Other: No focal inflammation.  No ascites.

Musculoskeletal: No significant skeletal lesion.
IMPRESSION: Large hiatal hernia.  No acute findings in the abdomen or pelvis.
